# Patient Record
Sex: Female | Born: 1990 | Race: White | Hispanic: No | Marital: Single | Smoking: Never smoker
Health system: Southern US, Community
[De-identification: ages and names within clinical notes are randomized; demographics above are authoritative.]

## PROBLEM LIST (undated history)

## (undated) DIAGNOSIS — F32A Depression, unspecified: Secondary | ICD-10-CM

## (undated) DIAGNOSIS — F419 Anxiety disorder, unspecified: Secondary | ICD-10-CM

## (undated) DIAGNOSIS — F329 Major depressive disorder, single episode, unspecified: Secondary | ICD-10-CM

---

## 2013-05-31 ENCOUNTER — Emergency Department (HOSPITAL_COMMUNITY)
Admission: EM | Admit: 2013-05-31 | Discharge: 2013-05-31 | Disposition: A | Discharge status: Home / Self Care | Payer: Managed Care, Other (non HMO) | Source: Home / Self Care

## 2013-05-31 ENCOUNTER — Encounter (HOSPITAL_COMMUNITY): Payer: Self-pay | Admitting: Emergency Medicine

## 2013-05-31 ENCOUNTER — Emergency Department (INDEPENDENT_AMBULATORY_CARE_PROVIDER_SITE_OTHER): Payer: Managed Care, Other (non HMO)

## 2013-05-31 DIAGNOSIS — Y9302 Activity, running: Secondary | ICD-10-CM

## 2013-05-31 DIAGNOSIS — S93409A Sprain of unspecified ligament of unspecified ankle, initial encounter: Secondary | ICD-10-CM

## 2013-05-31 DIAGNOSIS — S93402A Sprain of unspecified ligament of left ankle, initial encounter: Secondary | ICD-10-CM

## 2013-05-31 NOTE — ED Provider Notes (Signed)
Medical screening examination/treatment/procedure(s) were performed by non-physician practitioner and as supervising physician I was immediately available for consultation/collaboration.  Leslee Home, M.D.  Reuben Likes, MD 05/31/13 2221

## 2013-05-31 NOTE — ED Provider Notes (Signed)
CSN: [id number]     Arrival date & time 05/31/13  1422 History   First MD Initiated Contact with Patient 05/31/13 1612     Chief Complaint  Patient presents with  . Ankle Injury   (Consider location/radiation/quality/duration/timing/severity/associated sxs/prior Treatment) HPI Comments: While running this afternoon at 1:30 the patient experienced an inversion injury to the right ankle. She is complaining of lateral ankle pain and tenderness. Denies other injury.   History reviewed. No pertinent past medical history. History reviewed. No pertinent past surgical history. History reviewed. No pertinent family history. History  Substance Use Topics  . Smoking status: Never Smoker   . Smokeless tobacco: Not on file  . Alcohol Use: Yes   OB History   Grav Para Term Preterm Abortions TAB SAB Ect Mult Living                 Review of Systems  Constitutional: Negative for fever, chills and activity change.  HENT: Negative.   Respiratory: Negative.   Cardiovascular: Negative.   Musculoskeletal:       As per HPI  Skin: Negative for color change, pallor and rash.  Neurological: Negative.     Allergies  Review of patient's allergies indicates no known allergies.  Home Medications   Current Outpatient Rx  Name  Route  Sig  Dispense  Refill  . FLUoxetine (PROZAC) 20 MG tablet   Oral   Take 20 mg by mouth daily.          BP 114/79  Pulse 90  Temp(Src) 97.7 F (36.5 C) (Oral)  Resp 16  SpO2 100%  LMP 05/01/2013 Physical Exam  Nursing note and vitals reviewed. Constitutional: She is oriented to person, place, and time. She appears well-developed and well-nourished. No distress.  HENT:  Head: Normocephalic and atraumatic.  Eyes: EOM are normal.  Neck: Normal range of motion. Neck supple.  Pulmonary/Chest: Effort normal. No respiratory distress.  Musculoskeletal:  Tenderness with mild edema over the lateral malleolus. Distal N/V and M/S intact. Pedal pulse 1+. Nl warmth  and color. No foot tenderness. No ankle deformity.   Neurological: She is alert and oriented to person, place, and time. No cranial nerve deficit.  Skin: Skin is warm and dry.    ED Course  Procedures (including critical care time) Labs Review Labs Reviewed - No data to display Imaging Review Dg Ankle Complete Left  05/31/2013   CLINICAL DATA:  Twisted left ankle, lateral pain  EXAM: LEFT ANKLE COMPLETE - 3+ VIEW  COMPARISON:  None.  FINDINGS: Three views of left ankle submitted. No acute fracture or subluxation. Ankle mortise is preserved.  IMPRESSION: Negative.   Electronically Signed   By: Natasha Mead M.D.   On: 05/31/2013 16:32      MDM   1. Ankle sprain, left, initial encounter    RICE ASO Limit wt bearing for a few days MOtrin for pain     Hayden Rasmussen, NP 05/31/13 1704

## 2013-05-31 NOTE — ED Notes (Signed)
Reports rolling left ankle today around 1:30.  States having a lot of pain with walking. Mild swelling.  No otc meds taking for pain.

## 2014-02-27 ENCOUNTER — Encounter (HOSPITAL_COMMUNITY): Payer: Self-pay | Admitting: Emergency Medicine

## 2014-02-27 ENCOUNTER — Emergency Department (HOSPITAL_COMMUNITY)
Admission: EM | Admit: 2014-02-27 | Discharge: 2014-02-27 | Disposition: A | Discharge status: Home / Self Care | Payer: Managed Care, Other (non HMO) | Source: Home / Self Care | Attending: Family Medicine | Admitting: Family Medicine

## 2014-02-27 DIAGNOSIS — S93609A Unspecified sprain of unspecified foot, initial encounter: Secondary | ICD-10-CM

## 2014-02-27 DIAGNOSIS — S91309A Unspecified open wound, unspecified foot, initial encounter: Secondary | ICD-10-CM

## 2014-02-27 DIAGNOSIS — S91312A Laceration without foreign body, left foot, initial encounter: Secondary | ICD-10-CM

## 2014-02-27 DIAGNOSIS — S96912A Strain of unspecified muscle and tendon at ankle and foot level, left foot, initial encounter: Secondary | ICD-10-CM

## 2014-02-27 DIAGNOSIS — S9032XA Contusion of left foot, initial encounter: Secondary | ICD-10-CM

## 2014-02-27 DIAGNOSIS — S9030XA Contusion of unspecified foot, initial encounter: Secondary | ICD-10-CM

## 2014-02-27 MED ORDER — SILVER SULFADIAZINE 1 % EX CREA
TOPICAL_CREAM | CUTANEOUS | Status: AC
  Filled 2014-02-27: qty 85

## 2014-02-27 MED ORDER — CEPHALEXIN 500 MG PO CAPS: 500.0000 mg | ORAL_CAPSULE | Freq: Three times a day (TID) | ORAL | Status: DC
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
## 2014-02-27 NOTE — ED Notes (Signed)
Applied silvadene and dressing applied

## 2014-02-27 NOTE — ED Notes (Signed)
Right foot pain.  Patient reports she was wearing flip-flops on uneven ground, bark, twigs, rolled foot.  Afterwards noticed something sticky, then noticed the blood.  This occurred last night.  Today right foot is hurting.  Reports todays pain similar to an instep injury she had during the summer.  Looks like a puncture injury.  Denies etoh use last night.

## 2014-02-27 NOTE — Discharge Instructions (Signed)
Fortunately you do not likely have a broken foot.  You likely are suffering from what is called a soft tissue contusion to the bottom of the foot.  This should improve with rest, Motrin 600-800mg  every 6-8 hours, ACE wrapping, and ice for the next 24-48 hours Please let pain be your guide with regards to returning to normal activity Please start the antibiotics is your cut becomes painful, red, or develops discharge Please apply antibiotic ointment daily to the cut until it closes over.

## 2014-07-27 ENCOUNTER — Emergency Department (HOSPITAL_COMMUNITY)
Admission: EM | Admit: 2014-07-27 | Discharge: 2014-07-27 | Disposition: A | Discharge status: Home / Self Care | Payer: Managed Care, Other (non HMO) | Attending: Emergency Medicine | Admitting: Emergency Medicine

## 2014-07-27 ENCOUNTER — Emergency Department (HOSPITAL_COMMUNITY): Payer: Managed Care, Other (non HMO)

## 2014-07-27 ENCOUNTER — Encounter (HOSPITAL_COMMUNITY): Payer: Self-pay | Admitting: Oncology

## 2014-07-27 DIAGNOSIS — Z3202 Encounter for pregnancy test, result negative: Secondary | ICD-10-CM | POA: Diagnosis not present

## 2014-07-27 DIAGNOSIS — F419 Anxiety disorder, unspecified: Secondary | ICD-10-CM | POA: Diagnosis not present

## 2014-07-27 DIAGNOSIS — Y9389 Activity, other specified: Secondary | ICD-10-CM | POA: Diagnosis not present

## 2014-07-27 DIAGNOSIS — S80219A Abrasion, unspecified knee, initial encounter: Secondary | ICD-10-CM

## 2014-07-27 DIAGNOSIS — F329 Major depressive disorder, single episode, unspecified: Secondary | ICD-10-CM | POA: Diagnosis not present

## 2014-07-27 DIAGNOSIS — Y998 Other external cause status: Secondary | ICD-10-CM | POA: Insufficient documentation

## 2014-07-27 DIAGNOSIS — S80211A Abrasion, right knee, initial encounter: Secondary | ICD-10-CM | POA: Diagnosis not present

## 2014-07-27 DIAGNOSIS — Y92410 Unspecified street and highway as the place of occurrence of the external cause: Secondary | ICD-10-CM | POA: Insufficient documentation

## 2014-07-27 DIAGNOSIS — Z79899 Other long term (current) drug therapy: Secondary | ICD-10-CM | POA: Diagnosis not present

## 2014-07-27 DIAGNOSIS — S80212A Abrasion, left knee, initial encounter: Secondary | ICD-10-CM | POA: Diagnosis not present

## 2014-07-27 DIAGNOSIS — S99912A Unspecified injury of left ankle, initial encounter: Secondary | ICD-10-CM | POA: Diagnosis present

## 2014-07-27 DIAGNOSIS — S93401A Sprain of unspecified ligament of right ankle, initial encounter: Secondary | ICD-10-CM | POA: Diagnosis not present

## 2014-07-27 DIAGNOSIS — S93402A Sprain of unspecified ligament of left ankle, initial encounter: Secondary | ICD-10-CM

## 2014-07-27 HISTORY — DX: Depression, unspecified: F32.A

## 2014-07-27 HISTORY — DX: Major depressive disorder, single episode, unspecified: F32.9

## 2014-07-27 HISTORY — DX: Anxiety disorder, unspecified: F41.9

## 2014-07-27 LAB — POC URINE PREG, ED: Preg Test, Ur: NEGATIVE

## 2014-07-27 MED ORDER — HYDROCODONE-ACETAMINOPHEN 5-325 MG PO TABS
1.0000 | ORAL_TABLET | Freq: Once | ORAL | Status: AC
  Administered 2014-07-27: 1 via ORAL
  Filled 2014-07-27: qty 1

## 2014-07-27 MED ORDER — IBUPROFEN 800 MG PO TABS
800.0000 mg | ORAL_TABLET | Freq: Once | ORAL | Status: AC
  Administered 2014-07-27: 800 mg via ORAL
  Filled 2014-07-27: qty 1

## 2014-07-27 MED ORDER — HYDROCODONE-ACETAMINOPHEN 5-325 MG PO TABS: 1.0000 | ORAL_TABLET | ORAL | Status: AC | PRN

## 2014-07-27 MED ORDER — IBUPROFEN 800 MG PO TABS: 800.0000 mg | ORAL_TABLET | Freq: Three times a day (TID) | ORAL | Status: AC
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
## 2014-07-27 NOTE — Discharge Instructions (Signed)
Ankle Sprain °An ankle sprain is an injury to the strong, fibrous tissues (ligaments) that hold the bones of your ankle joint together.  °CAUSES °An ankle sprain is usually caused by a fall or by twisting your ankle. Ankle sprains most commonly occur when you step on the outer edge of your foot, and your ankle turns inward. People who participate in sports are more prone to these types of injuries.  °SYMPTOMS  °· Pain in your ankle. The pain may be present at rest or only when you are trying to stand or walk. °· Swelling. °· Bruising. Bruising may develop immediately or within 1 to 2 days after your injury. °· Difficulty standing or walking, particularly when turning corners or changing directions. °DIAGNOSIS  °Your caregiver will ask you details about your injury and perform a physical exam of your ankle to determine if you have an ankle sprain. During the physical exam, your caregiver will press on and apply pressure to specific areas of your foot and ankle. Your caregiver will try to move your ankle in certain ways. An X-ray exam may be done to be sure a bone was not broken or a ligament did not separate from one of the bones in your ankle (avulsion fracture).  °TREATMENT  °Certain types of braces can help stabilize your ankle. Your caregiver can make a recommendation for this. Your caregiver may recommend the use of medicine for pain. If your sprain is severe, your caregiver may refer you to a surgeon who helps to restore function to parts of your skeletal system (orthopedist) or a physical therapist. °HOME CARE INSTRUCTIONS  °· Apply ice to your injury for 1-2 days or as directed by your caregiver. Applying ice helps to reduce inflammation and pain. °· Put ice in a plastic bag. °· Place a towel between your skin and the bag. °· Leave the ice on for 15-20 minutes at a time, every 2 hours while you are awake. °· Only take over-the-counter or prescription medicines for pain, discomfort, or fever as directed by  your caregiver. °· Elevate your injured ankle above the level of your heart as much as possible for 2-3 days. °· If your caregiver recommends crutches, use them as instructed. Gradually put weight on the affected ankle. Continue to use crutches or a cane until you can walk without feeling pain in your ankle. °· If you have a plaster splint, wear the splint as directed by your caregiver. Do not rest it on anything harder than a pillow for the first 24 hours. Do not put weight on it. Do not get it wet. You may take it off to take a shower or bath. °· You may have been given an elastic bandage to wear around your ankle to provide support. If the elastic bandage is too tight (you have numbness or tingling in your foot or your foot becomes cold and blue), adjust the bandage to make it comfortable. °· If you have an air splint, you may blow more air into it or let air out to make it more comfortable. You may take your splint off at night and before taking a shower or bath. Wiggle your toes in the splint several times per day to decrease swelling. °SEEK MEDICAL CARE IF:  °· You have rapidly increasing bruising or swelling. °· Your toes feel extremely cold or you lose feeling in your foot. °· Your pain is not relieved with medicine. °SEEK IMMEDIATE MEDICAL CARE IF: °· Your toes are numb or blue. °·   You have severe pain that is increasing. MAKE SURE YOU:   Understand these instructions.  Will watch your condition.  Will get help right away if you are not doing well or get worse. Document Released: 06/13/2005 Document Revised: 03/07/2012 Document Reviewed: 06/25/2011 [facility] Patient Information 2015 [place], [place]. This information is not intended to replace advice given to you by your health care provider. Make sure you discuss any questions you have with your health care provider.  Abrasion An abrasion is a cut or scrape of the skin. Abrasions do not extend through all layers of the skin and most heal within  10 days. It is important to care for your abrasion properly to prevent infection. CAUSES  Most abrasions are caused by falling on, or gliding across, the ground or other surface. When your skin rubs on something, the outer and inner layer of skin rubs off, causing an abrasion. DIAGNOSIS  Your caregiver will be able to diagnose an abrasion during a physical exam.  TREATMENT  Your treatment depends on how large and deep the abrasion is. Generally, your abrasion will be cleaned with water and a mild soap to remove any dirt or debris. An antibiotic ointment may be put over the abrasion to prevent an infection. A bandage (dressing) may be wrapped around the abrasion to keep it from getting dirty.  You may need a tetanus shot if:  You cannot remember when you had your last tetanus shot.  You have never had a tetanus shot.  The injury broke your skin. If you get a tetanus shot, your arm may swell, get red, and feel warm to the touch. This is common and not a problem. If you need a tetanus shot and you choose not to have one, there is a rare chance of getting tetanus. Sickness from tetanus can be serious.  HOME CARE INSTRUCTIONS   If a dressing was applied, change it at least once a day or as directed by your caregiver. If the bandage sticks, soak it off with warm water.   Wash the area with water and a mild soap to remove all the ointment 2 times a day. Rinse off the soap and pat the area dry with a clean towel.   Reapply any ointment as directed by your caregiver. This will help prevent infection and keep the bandage from sticking. Use gauze over the wound and under the dressing to help keep the bandage from sticking.   Change your dressing right away if it becomes wet or dirty.   Only take over-the-counter or prescription medicines for pain, discomfort, or fever as directed by your caregiver.   Follow up with your caregiver within 24-48 hours for a wound check, or as directed. If you were  not given a wound-check appointment, look closely at your abrasion for redness, swelling, or pus. These are signs of infection. SEEK IMMEDIATE MEDICAL CARE IF:   You have increasing pain in the wound.   You have redness, swelling, or tenderness around the wound.   You have pus coming from the wound.   You have a fever or persistent symptoms for more than 2-3 days.  You have a fever and your symptoms suddenly get worse.  You have a bad smell coming from the wound or dressing.  MAKE SURE YOU:   Understand these instructions.  Will watch your condition.  Will get help right away if you are not doing well or get worse. Document Released: 03/23/2005 Document Revised: 05/30/2012 Document Reviewed: 05/17/2011  [employer] Patient Information 2015 [place], [place]. This information is not intended to replace advice given to you by your health care provider. Make sure you discuss any questions you have with your health care provider. Cryotherapy Cryotherapy means treatment with cold. Ice or gel packs can be used to reduce both pain and swelling. Ice is the most helpful within the first 24 to 48 hours after an injury or flare-up from overusing a muscle or joint. Sprains, strains, spasms, burning pain, shooting pain, and aches can all be eased with ice. Ice can also be used when recovering from surgery. Ice is effective, has very few side effects, and is safe for most people to use. PRECAUTIONS  Ice is not a safe treatment option for people with:  Raynaud phenomenon. This is a condition affecting small blood vessels in the extremities. Exposure to cold may cause your problems to return.  Cold hypersensitivity. There are many forms of cold hypersensitivity, including:  Cold urticaria. Red, itchy hives appear on the skin when the tissues begin to warm after being iced.  Cold erythema. This is a red, itchy rash caused by exposure to cold.  Cold hemoglobinuria. Red blood cells break down when the  tissues begin to warm after being iced. The hemoglobin that carry oxygen are passed into the urine because they cannot combine with blood proteins fast enough.  Numbness or altered sensitivity in the area being iced. If you have any of the following conditions, do not use ice until you have discussed cryotherapy with your caregiver:  Heart conditions, such as arrhythmia, angina, or chronic heart disease.  High blood pressure.  Healing wounds or open skin in the area being iced.  Current infections.  Rheumatoid arthritis.  Poor circulation.  Diabetes. Ice slows the blood flow in the region it is applied. This is beneficial when trying to stop inflamed tissues from spreading irritating chemicals to surrounding tissues. However, if you expose your skin to cold temperatures for too long or without the proper protection, you can damage your skin or nerves. Watch for signs of skin damage due to cold. HOME CARE INSTRUCTIONS Follow these tips to use ice and cold packs safely.  Place a dry or damp towel between the ice and skin. A damp towel will cool the skin more quickly, so you may need to shorten the time that the ice is used.  For a more rapid response, add gentle compression to the ice.  Ice for no more than 10 to 20 minutes at a time. The bonier the area you are icing, the less time it will take to get the benefits of ice.  Check your skin after 5 minutes to make sure there are no signs of a poor response to cold or skin damage.  Rest 20 minutes or more between uses.  Once your skin is numb, you can end your treatment. You can test numbness by very lightly touching your skin. The touch should be so light that you do not see the skin dimple from the pressure of your fingertip. When using ice, most people will feel these normal sensations in this order: cold, burning, aching, and numbness.  Do not use ice on someone who cannot communicate their responses to pain, such as small children  or people with dementia. HOW TO MAKE AN ICE PACK Ice packs are the most common way to use ice therapy. Other methods include ice massage, ice baths, and cryosprays. Muscle creams that cause a cold, tingly feeling do not  offer the same benefits that ice offers and should not be used as a substitute unless recommended by your caregiver. To make an ice pack, do one of the following:  Place crushed ice or a bag of frozen vegetables in a sealable plastic bag. Squeeze out the excess air. Place this bag inside another plastic bag. Slide the bag into a pillowcase or place a damp towel between your skin and the bag.  Mix 3 parts water with 1 part rubbing alcohol. Freeze the mixture in a sealable plastic bag. When you remove the mixture from the freezer, it will be slushy. Squeeze out the excess air. Place this bag inside another plastic bag. Slide the bag into a pillowcase or place a damp towel between your skin and the bag. SEEK MEDICAL CARE IF:  You develop white spots on your skin. This may give the skin a blotchy (mottled) appearance.  Your skin turns blue or pale.  Your skin becomes waxy or hard.  Your swelling gets worse. MAKE SURE YOU:   Understand these instructions.  Will watch your condition.  Will get help right away if you are not doing well or get worse. Document Released: 02/07/2011 Document Revised: 10/28/2013 Document Reviewed: 02/07/2011 [facility] Patient Information 2015 [place], [place]. This information is not intended to replace advice given to you by your health care provider. Make sure you discuss any questions you have with your health care provider.

## 2014-07-27 NOTE — ED Notes (Signed)
Pt back from X-ray.  
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
## 2014-07-27 NOTE — ED Notes (Signed)
Pt transported to Xray. 

## 2016-09-25 ENCOUNTER — Encounter (HOSPITAL_COMMUNITY): Payer: Self-pay

## 2016-09-25 ENCOUNTER — Emergency Department (HOSPITAL_COMMUNITY)
Admission: EM | Admit: 2016-09-25 | Discharge: 2016-09-25 | Disposition: A | Discharge status: Home / Self Care | Payer: BLUE CROSS/BLUE SHIELD | Attending: Emergency Medicine | Admitting: Emergency Medicine

## 2016-09-25 ENCOUNTER — Other Ambulatory Visit: Payer: Self-pay

## 2016-09-25 ENCOUNTER — Emergency Department (HOSPITAL_COMMUNITY): Payer: BLUE CROSS/BLUE SHIELD

## 2016-09-25 DIAGNOSIS — R079 Chest pain, unspecified: Secondary | ICD-10-CM | POA: Diagnosis present

## 2016-09-25 DIAGNOSIS — R0789 Other chest pain: Secondary | ICD-10-CM | POA: Diagnosis not present

## 2016-09-25 LAB — CBC
HCT: 37.8 % (ref 36.0–46.0)
HEMOGLOBIN: 12.9 g/dL (ref 12.0–15.0)
MCH: 29.1 pg (ref 26.0–34.0)
MCHC: 34.1 g/dL (ref 30.0–36.0)
MCV: 85.1 fL (ref 78.0–100.0)
Platelets: 339 10*3/uL (ref 150–400)
RBC: 4.44 MIL/uL (ref 3.87–5.11)
RDW: 13.4 % (ref 11.5–15.5)
WBC: 9.2 10*3/uL (ref 4.0–10.5)

## 2016-09-25 LAB — BASIC METABOLIC PANEL
ANION GAP: 8 (ref 5–15)
BUN: 15 mg/dL (ref 6–20)
CHLORIDE: 109 mmol/L (ref 101–111)
CO2: 21 mmol/L — AB (ref 22–32)
CREATININE: 0.82 mg/dL (ref 0.44–1.00)
Calcium: 9.4 mg/dL (ref 8.9–10.3)
GFR calc Af Amer: >60 mL/min (ref >60)
GFR calc non Af Amer: >60 mL/min (ref >60)
GLUCOSE: 109 mg/dL — AB (ref 65–99)
Potassium: 3.9 mmol/L (ref 3.5–5.1)
Sodium: 138 mmol/L (ref 135–145)

## 2016-09-25 LAB — I-STAT TROPONIN, ED: TROPONIN I, POC: 0 ng/mL (ref 0.00–0.08)

## 2016-09-25 LAB — D-DIMER, QUANTITATIVE: D-Dimer, Quant: <0.27 ug/mL-FEU (ref 0.00–0.50)

## 2016-09-25 MED ORDER — KETOROLAC TROMETHAMINE 30 MG/ML IJ SOLN
30.0000 mg | Freq: Once | INTRAMUSCULAR | Status: AC
  Administered 2016-09-25: 30 mg via INTRAVENOUS
  Filled 2016-09-25: qty 1

## 2016-09-25 MED ORDER — METHOCARBAMOL 500 MG PO TABS: ORAL_TABLET | ORAL | 0 refills | Status: AC

## 2016-09-25 MED ORDER — METHOCARBAMOL 500 MG PO TABS
1000.0000 mg | ORAL_TABLET | Freq: Once | ORAL | Status: AC
Start: 2016-09-25 — End: 2016-09-25
  Administered 2016-09-25: 1000 mg via ORAL
  Filled 2016-09-25: qty 2

## 2016-09-25 MED ORDER — NAPROXEN 500 MG PO TABS: ORAL_TABLET | ORAL | 0 refills | Status: AC

## 2016-09-25 NOTE — ED Notes (Signed)
Pt understood dc material. NAD noted. Scripts given at dc 

## 2016-09-25 NOTE — ED Notes (Signed)
Nurse drawing addl labs 

## 2016-09-25 NOTE — ED Triage Notes (Signed)
Pt complaining of R sided chest pain that radiates to R shoulder. Pt denies any injury/trauma to chest. Pt denies any cough. Pt states hx of bronchitis.

## 2016-09-25 NOTE — ED Provider Notes (Signed)
[facility]-EMERGENCY DEPT Provider Note   CSN: [id number] & time: 09/25/16  0118   By signing my name below, I, Soijett Blue, attest that this documentation has been prepared under the direction and in the presence of Devoria Albe, MD. Electronically Signed: Soijett Blue, ED Scribe. 09/25/16. 4:57 AM.  Time seen 03:11 AM  History   Chief Complaint Chief Complaint  Patient presents with  . Chest Pain    HPI Marilyn Wheeler is a 26 y.o. female who presents to the Emergency Department complaining of waxing and waning, sharp, right upper CP onset 9:30 AM yesterday morning, March 31. She notes that she was sleeping when the CP began and woke her from her sleep. She states that her right upper chest pain is worsened with right arm movement, talking, any body movement. Staying still makes it better. Pt notes that she doesn't have a hx of similar symptoms. Pt reports associated SOB. Pt has tried 2 Advil with no relief of her symptoms. 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 Pt reports that she is right hand dominant. Pt takes oral contraceptives and Prozac. She denies nausea, vomiting, diaphoresis, leg pain, leg swelling, calf pain, and any other symptoms. Denies having a PCP in the area, smoking cigarettes, but endorses social ETOH use. Pt notes that her grandmother had a hx of DVT's and denies family cardiac issues.    The history is provided by the patient. No language interpreter was used.    PCP none  Past Medical History:  Diagnosis Date  . Anxiety   . Depression     There are no active problems to display for this patient.   History reviewed. No pertinent surgical history.  OB History    No data available       Home Medications    Prior to Admission medications   Medication Sig Start Date End Date Taking? Authorizing Provider  cephALEXin (KEFLEX) 500 MG capsule Take 1 capsule (500 mg total) by mouth 3  (three) times daily. Patient not taking: Reported on 07/27/2014 02/27/14   Ozella Rocks, MD  FLUoxetine (PROZAC) 20 MG tablet Take 40 mg by mouth daily.     Historical Provider, MD  HYDROcodone-acetaminophen (NORCO/VICODIN) 5-325 MG per tablet Take 1-2 tablets by mouth every 4 (four) hours as needed. 07/27/14   Elpidio Anis, PA-C  ibuprofen (ADVIL,MOTRIN) 800 MG tablet Take 1 tablet (800 mg total) by mouth 3 (three) times daily. 07/27/14   Elpidio Anis, PA-C  methocarbamol (ROBAXIN) 500 MG tablet Take 1 or 2 po Q 6hrs for muscle pain 09/25/16   Devoria Albe, MD  naproxen (NAPROSYN) 500 MG tablet Take 1 po BID with food prn pain 09/25/16   Devoria Albe, MD  Norethindrone Acet-Ethinyl Est (GILDESS 1/20 PO) Take 1 tablet by mouth every morning.     Historical Provider, MD    Family History Family History  Problem Relation Age of Onset  . Hypertension Father     Social History Social History  Substance Use Topics  . Smoking status: Never Smoker  . Smokeless tobacco: Never Used  . Alcohol use Yes  employed   Allergies   Patient has no known allergies.   Review of Systems Review of Systems  Constitutional: Negative for diaphoresis.  Respiratory: Positive for shortness of breath.   Cardiovascular: Positive for chest pain (right upper). Negative for leg swelling.  Gastrointestinal: Negative for nausea and vomiting.  Musculoskeletal: Negative  for arthralgias and myalgias.  All other systems reviewed and are negative.   Physical Exam Updated Vital Signs BP (!) 155/108 (BP Location: Left Arm)   Pulse 98   Temp 97.6 F (36.4 C) (Oral)   Resp 16   Ht  (1.727 m)   Wt 190 lb (86.2 kg)   LMP 09/22/2016 (Exact Date)   SpO2 99%   BMI 28.89 kg/m   Vital signs normal except for hypertension   Physical Exam  Constitutional: She is oriented to person, place, and time. She appears well-developed and well-nourished.  Non-toxic appearance. She does not appear ill. No distress.  HENT:    Head: Normocephalic and atraumatic.  Right Ear: External ear normal.  Left Ear: External ear normal.  Nose: Nose normal. No mucosal edema or rhinorrhea.  Mouth/Throat: Oropharynx is clear and moist and mucous membranes are normal. No dental abscesses or uvula swelling.  Eyes: Conjunctivae and EOM are normal. Pupils are equal, round, and reactive to light.  Neck: Normal range of motion and full passive range of motion without pain. Neck supple.  Cardiovascular: Normal rate, regular rhythm and normal heart sounds.  Exam reveals no gallop and no friction rub.   No murmur heard. Pulmonary/Chest: Effort normal and breath sounds normal. No respiratory distress. She has no wheezes. She has no rhonchi. She has no rales. She exhibits tenderness. She exhibits no crepitus.    Mild tenderness to right upper chest with palpation and ROM of right arm.   Abdominal: Soft. Normal appearance and bowel sounds are normal. She exhibits no distension. There is no tenderness. There is no rebound and no guarding.  Musculoskeletal: Normal range of motion. She exhibits no edema or tenderness.  Moves all extremities well.   Neurological: She is alert and oriented to person, place, and time. She has normal strength. No cranial nerve deficit.  Skin: Skin is warm, dry and intact. No rash noted. No erythema. No pallor.  Psychiatric: She has a normal mood and affect. Her speech is normal and behavior is normal. Her mood appears not anxious.  Nursing note and vitals reviewed.    ED Treatments / Results  Labs (all labs ordered are listed, but only abnormal results are displayed) Results for orders placed or performed during the hospital encounter of 09/25/16  Basic metabolic panel  Result Value Ref Range   Sodium 138 135 - 145 mmol/L   Potassium 3.9 3.5 - 5.1 mmol/L   Chloride 109 101 - 111 mmol/L   CO2 21 (L) 22 - 32 mmol/L   Glucose, Bld 109 (H) 65 - 99 mg/dL   BUN 15 6 - 20 mg/dL   Creatinine, Ser 1.61 0.44 -  1.00 mg/dL   Calcium 9.4 8.9 - 09.6 mg/dL   GFR calc non Af Amer >60 >60 mL/min   GFR calc Af Amer >60 >60 mL/min   Anion gap 8 5 - 15  CBC  Result Value Ref Range   WBC 9.2 4.0 - 10.5 K/uL   RBC 4.44 3.87 - 5.11 MIL/uL   Hemoglobin 12.9 12.0 - 15.0 g/dL   HCT 04.5 40.9 - 81.1 %   MCV 85.1 78.0 - 100.0 fL   MCH 29.1 26.0 - 34.0 pg   MCHC 34.1 30.0 - 36.0 g/dL   RDW 91.4 78.2 - 95.6 %   Platelets 339 150 - 400 K/uL  D-dimer, quantitative  Result Value Ref Range   D-Dimer, Quant <0.27 0.00 - 0.50 ug/mL-FEU  I-stat troponin, ED  Result Value Ref Range   Troponin i, poc 0.00 0.00 - 0.08 ng/mL   Comment 3           Laboratory interpretation all normal     EKG  EKG Interpretation  Date/Time:  Sunday September 25 2016 01:23:31 EDT Ventricular Rate:  78 PR Interval:  162 QRS Duration: 82 QT Interval:  344 QTC Calculation: 392 R Axis:   80 Text Interpretation:  Unusual P axis, possible ectopic atrial rhythm Normal ECG No old tracing to compare Confirmed by Buffi Ewton  MD-I, Roy Snuffer ([id number]) on 09/25/2016 2:21:17 AM       Radiology Dg Chest 2 View  Result Date: 09/25/2016 CLINICAL DATA:  Mid chest pain for 1 day, shortness of breath. Mild bronchitic changes. EXAM: CHEST  2 VIEW COMPARISON:  None. FINDINGS: Cardiomediastinal silhouette is normal. No pleural effusions or focal consolidations. Trachea projects midline and there is no pneumothorax. Soft tissue planes and included osseous structures are non-suspicious. IMPRESSION: Mild bronchitic changes without focal consolidation. Electronically Signed   By: Awilda Metro M.D.   On: 09/25/2016 02:02    Procedures Procedures (including critical care time)  Medications Ordered in ED Medications  ketorolac (TORADOL) 30 MG/ML injection 30 mg (30 mg Intravenous Given 09/25/16 0340)  methocarbamol (ROBAXIN) tablet 1,000 mg (1,000 mg Oral Given 09/25/16 0340)     Initial Impression / Assessment and Plan / ED Course  I have reviewed the triage  vital signs and the nursing notes.  Pertinent labs & imaging results that were available during my care of the patient were reviewed by me and considered in my medical decision making (see chart for details).     DIAGNOSTIC STUDIES: Oxygen Saturation is 99% on RA, nl by my interpretation.    COORDINATION OF CARE:  Discussed her laboratory, EKG and CXR results.  Treatment plan includes adding a ddimer and IV toradol and oral muscle relaxer.  Since her pain has been there constantly all day a seconds or delta troponin was not done. Her pain appears to be musculoskeletal in origin. However d-dimer was done due to her being on birth control pills and having some shortness of breath.  At time of discharge her pain is better not gone.    Final Clinical Impressions(s) / ED Diagnoses   Final diagnoses:  Chest wall pain    New Prescriptions New Prescriptions   METHOCARBAMOL (ROBAXIN) 500 MG TABLET    Take 1 or 2 po Q 6hrs for muscle pain   NAPROXEN (NAPROSYN) 500 MG TABLET    Take 1 po BID with food prn pain   Plan discharge  Devoria Albe, MD, FACEP  I personally performed the services described in this documentation, which was scribed in my presence. The recorded information has been reviewed and considered.  Devoria Albe, MD, Concha Pyo, MD 09/25/16 [phone]

## 2016-09-25 NOTE — Discharge Instructions (Signed)
Use ice and heat for comfort. Take the medications as prescribed. Recheck if you get worse, such as fever, struggling to breathe, worsening pain.

## 2018-03-26 ENCOUNTER — Other Ambulatory Visit: Payer: Self-pay

## 2018-03-26 ENCOUNTER — Encounter (HOSPITAL_COMMUNITY): Payer: Self-pay | Admitting: *Deleted

## 2018-03-26 ENCOUNTER — Emergency Department (HOSPITAL_COMMUNITY)
Admission: EM | Admit: 2018-03-26 | Discharge: 2018-03-26 | Disposition: A | Discharge status: Home / Self Care | Payer: BLUE CROSS/BLUE SHIELD | Attending: Emergency Medicine | Admitting: Emergency Medicine

## 2018-03-26 ENCOUNTER — Emergency Department (HOSPITAL_COMMUNITY): Payer: BLUE CROSS/BLUE SHIELD

## 2018-03-26 DIAGNOSIS — M25512 Pain in left shoulder: Secondary | ICD-10-CM | POA: Diagnosis present

## 2018-03-26 DIAGNOSIS — R0789 Other chest pain: Secondary | ICD-10-CM | POA: Diagnosis not present

## 2018-03-26 DIAGNOSIS — L0291 Cutaneous abscess, unspecified: Secondary | ICD-10-CM

## 2018-03-26 DIAGNOSIS — L02414 Cutaneous abscess of left upper limb: Secondary | ICD-10-CM | POA: Diagnosis not present

## 2018-03-26 LAB — CBC
HEMATOCRIT: 37.4 % (ref 36.0–46.0)
HEMOGLOBIN: 12.5 g/dL (ref 12.0–15.0)
MCH: 29.1 pg (ref 26.0–34.0)
MCHC: 33.4 g/dL (ref 30.0–36.0)
MCV: 87 fL (ref 78.0–100.0)
Platelets: 407 10*3/uL — ABNORMAL HIGH (ref 150–400)
RBC: 4.3 MIL/uL (ref 3.87–5.11)
RDW: 13.1 % (ref 11.5–15.5)
WBC: 8.7 10*3/uL (ref 4.0–10.5)

## 2018-03-26 LAB — BASIC METABOLIC PANEL
ANION GAP: 9 (ref 5–15)
BUN: 15 mg/dL (ref 6–20)
CO2: 23 mmol/L (ref 22–32)
Calcium: 9.3 mg/dL (ref 8.9–10.3)
Chloride: 108 mmol/L (ref 98–111)
Creatinine, Ser: 0.61 mg/dL (ref 0.44–1.00)
GFR calc Af Amer: >60 mL/min (ref >60)
GLUCOSE: 84 mg/dL (ref 70–99)
POTASSIUM: 4.1 mmol/L (ref 3.5–5.1)
Sodium: 140 mmol/L (ref 135–145)

## 2018-03-26 LAB — I-STAT BETA HCG BLOOD, ED (NOT ORDERABLE): I-stat hCG, quantitative: <5 m[IU]/mL (ref <5)

## 2018-03-26 MED ORDER — CEPHALEXIN 500 MG PO CAPS: 500.0000 mg | ORAL_CAPSULE | Freq: Four times a day (QID) | ORAL | 0 refills | Status: AC

## 2018-03-26 MED ORDER — IBUPROFEN 200 MG PO TABS
600.0000 mg | ORAL_TABLET | Freq: Once | ORAL | Status: AC
  Administered 2018-03-26: 600 mg via ORAL
  Filled 2018-03-26: qty 3

## 2018-03-26 MED ORDER — LIDOCAINE HCL (PF) 1 % IJ SOLN
5.0000 mL | Freq: Once | INTRAMUSCULAR | Status: AC
  Administered 2018-03-26: 5 mL
  Filled 2018-03-26: qty 30

## 2018-03-26 NOTE — Discharge Instructions (Addendum)
I have prescribed antibiotics please take as directed.If you experience any fever, worsening symptoms please return to the ED for reevaluation.

## 2018-03-26 NOTE — ED Triage Notes (Signed)
Pt reports noticing an abscess in her R upper medial arm last night and is worsening.  She also endorses waking up with a cp this am.  She denies any SOB, dizziness, or n/v.  She reports being at a music festival last week and was outside.  Does not recall having any insect bite to her R upper arm.  Edema and erythema noted and is tender.

## 2018-03-26 NOTE — ED Provider Notes (Signed)
[facility]-EMERGENCY DEPT Provider Note   CSN: [id number] Arrival date & time: 03/26/18  1551     History   Chief Complaint Chief Complaint  Patient presents with  . Chest Pain  . Abscess    HPI Marilyn Wheeler is a 27 y.o. female.  27 y/o female with a PMH of anxiety presents to the ED with a chief complaint of left arm abscess which began 2 days ago.Patient reports she's had multiple abscesses previously but this one has gotten bigger. She reports the pain as pressure around her left shoulder region with no radiation. She has tried warm compresses and bacitracin and a Band-Aid but this has not help with the healing process. Patient was at work today when she was told by her manager that she might have a staph infection, after this she began to experience tight chest pressure along the left side of her chest along with difficulty breathing.She describes it as an 8/10. She reports now that the chest pain has subsided and states she thinks this was her anxiety due to fear of having a staph infection.She denies any alleviating symptoms, diaphoresis, fever, previous history of MRSA.      Past Medical History:  Diagnosis Date  . Anxiety   . Depression     There are no active problems to display for this patient.   History reviewed. No pertinent surgical history.   OB History   None      Home Medications    Prior to Admission medications   Medication Sig Start Date End Date Taking? Authorizing Provider  aspirin EC 325 MG tablet Take 325 mg by mouth every 6 (six) hours as needed for mild pain.   Yes [provider]  cephALEXin (KEFLEX) 500 MG capsule Take 1 capsule (500 mg total) by mouth 4 (four) times daily for 7 days. 03/26/18 04/02/18  Claude Manges, PA-C  HYDROcodone-acetaminophen (NORCO/VICODIN) 5-325 MG per tablet Take 1-2 tablets by mouth every 4 (four) hours as needed. Patient not taking: Reported on 03/26/2018 07/27/14   Elpidio Anis,  PA-C  ibuprofen (ADVIL,MOTRIN) 800 MG tablet Take 1 tablet (800 mg total) by mouth 3 (three) times daily. Patient not taking: Reported on 03/26/2018 07/27/14   Elpidio Anis, PA-C  methocarbamol (ROBAXIN) 500 MG tablet Take 1 or 2 po Q 6hrs for muscle pain Patient not taking: Reported on 03/26/2018 09/25/16   Devoria Albe, MD  naproxen (NAPROSYN) 500 MG tablet Take 1 po BID with food prn pain Patient not taking: Reported on 03/26/2018 09/25/16   Devoria Albe, MD    Family History Family History  Problem Relation Age of Onset  . Hypertension Father     Social History Social History   Tobacco Use  . Smoking status: Never Smoker  . Smokeless tobacco: Never Used  Substance Use Topics  . Alcohol use: Yes  . Drug use: No     Allergies   Patient has no known allergies.   Review of Systems Review of Systems  Constitutional: Negative for chills and fever.  HENT: Negative for sore throat.   Respiratory: Positive for chest tightness and shortness of breath. Negative for wheezing.   Cardiovascular: Negative for chest pain.  Gastrointestinal: Positive for nausea. Negative for abdominal pain, diarrhea and vomiting.  Genitourinary: Negative for dysuria and flank pain.  Musculoskeletal: Negative for arthralgias and back pain.  Skin: Positive for color change and wound.  Neurological: Negative for headaches.  All other systems reviewed and are negative.  Physical Exam Updated Vital Signs BP 129/88   Pulse 83   Temp 98.8 F (37.1 C) (Oral)   Resp 13   Ht 5\' 8"  (1.727 m)   Wt 81.6 kg   LMP 02/26/2018   SpO2 100%   BMI 27.37 kg/m   Physical Exam  Constitutional: She is oriented to person, place, and time. She appears well-developed.  HENT:  Head: Normocephalic and atraumatic.  Neck: Normal range of motion. Neck supple.  Cardiovascular: Normal rate.  Pulmonary/Chest: Effort normal. She has no decreased breath sounds.  Abdominal: Soft. Bowel sounds are normal.  Musculoskeletal:         Right lower leg: She exhibits no edema.       Left lower leg: She exhibits no edema.  Neurological: She is alert and oriented to person, place, and time.  Skin: Skin is warm and dry. There is erythema.     erythematous, indurated, fluctuant abscess to left shoulder region.   Nursing note and vitals reviewed.    ED Treatments / Results  Labs (all labs ordered are listed, but only abnormal results are displayed) Labs Reviewed  CBC - Abnormal; Notable for the following components:      Result Value   Platelets 407 (*)    All other components within normal limits  BASIC METABOLIC PANEL  I-STAT TROPONIN, ED  I-STAT BETA HCG BLOOD, ED ([facility], [facility], [facility] ONLY)  I-STAT BETA HCG BLOOD, ED (NOT ORDERABLE)    EKG None  Radiology Dg Chest 2 View  Result Date: 03/26/2018 CLINICAL DATA:  Soft tissue infection of the right arm. EXAM: CHEST - 2 VIEW COMPARISON:  09/25/2016 FINDINGS: Heart size is normal. Mediastinal shadows are normal. The lungs are clear. No bronchial thickening. No infiltrate, mass, effusion or collapse. Pulmonary vascularity is normal. No bony abnormality. IMPRESSION: Normal chest Electronically Signed   By: Paulina Fusi M.D.   On: 03/26/2018 16:23    Procedures .Marland KitchenIncision and Drainage Date/Time: 03/26/2018 9:47 PM Performed by: Claude Manges, PA-C Authorized by: Claude Manges, PA-C   Consent:    Consent obtained:  Verbal   Consent given by:  Patient   Risks discussed:  Bleeding, incomplete drainage, infection and pain   Alternatives discussed:  Delayed treatment and alternative treatment Location:    Type:  Abscess   Location:  Upper extremity   Upper extremity location:  Shoulder   Shoulder location:  L shoulder Pre-procedure details:    Skin preparation:  Betadine Anesthesia (see MAR for exact dosages):    Anesthesia method:  Local infiltration   Local anesthetic:  Lidocaine 1% w/o epi Procedure type:    Complexity:  Simple Procedure details:    Needle  aspiration: no     Incision types:  Stab incision   Incision depth:  Dermal   Scalpel blade:  11   Wound management:  Irrigated with saline and probed and deloculated   Drainage:  Serosanguinous   Drainage amount:  Scant   Wound treatment:  Drain placed   Packing materials:  1/2 in iodoform gauze   Amount 1/2" iodoform:  4 cm Post-procedure details:    Patient tolerance of procedure:  Tolerated well, no immediate complications   (including critical care time)  Medications Ordered in ED Medications  ibuprofen (ADVIL,MOTRIN) tablet 600 mg (600 mg Oral Given 03/26/18 2056)  lidocaine (PF) (XYLOCAINE) 1 % injection 5 mL (5 mLs Infiltration Given 03/26/18 2056)     Initial Impression / Assessment and Plan / ED Course  I have reviewed the triage vital signs and the nursing notes.  Pertinent labs & imaging results that were available during my care of the patient were reviewed by me and considered in my medical decision making (see chart for details).  Clinical Course as of Mar 26 2154  [facility] Mar 26, 2018  2043 Platelets(!): 407 [JS]    Clinical Course User Index [JS] Claude Manges, [place]    She is here for a left arm abscess which began 2 days ago.  She has tried warm compresses, bacitracin bandage and states no relieving symptoms.  Patient reports also an episode of chest pain this afternoon with her boss told her that she might have a staph infection.  Patient began to feel a left-sided chest tightness which was worse with breathing.  She now reports her chest pain has subsided and believe this was just an anxiety attack due to her boss telling her that she might have staff.  At this time there is a left indurated erythematous abscess to her left shoulder had a shared decision-making conversation with patient in which she would like for abscess to drain.  I will drain this abscess for patient and place patient on antibiotics.  We will give her Tylenol for her pain prior to abscess  drainage.  CBC showed slight elevation in platelets. BMP showed no electrolyte abnormality. DG chest showed no infiltrate, pneumothorax, pleural effusion. I stat troponin was collected.However, patient denies any chest pain at the time and states this was due to the fact that her boss told her she might have a staph infection. I performed an I&D on patient, she tolerated the procedure well. I will send patient with PO antimitotics.She is afebrile at this time, vitals stable during ED course, patients stable for discharge.     Final Clinical Impressions(s) / ED Diagnoses   Final diagnoses:  Abscess  Feeling of chest tightness    ED Discharge Orders         Ordered    cephALEXin (KEFLEX) 500 MG capsule  4 times daily     03/26/18 2155           Claude Manges, PA-C 03/26/18 2156    Tegeler, Canary Brim, MD 03/26/18 2330

## 2018-04-12 ENCOUNTER — Emergency Department (HOSPITAL_COMMUNITY)
Admission: EM | Admit: 2018-04-12 | Discharge: 2018-04-12 | Disposition: A | Discharge status: Home / Self Care | Payer: BLUE CROSS/BLUE SHIELD | Attending: Emergency Medicine | Admitting: Emergency Medicine

## 2018-04-12 ENCOUNTER — Other Ambulatory Visit: Payer: Self-pay

## 2018-04-12 ENCOUNTER — Encounter (HOSPITAL_COMMUNITY): Payer: Self-pay | Admitting: Emergency Medicine

## 2018-04-12 DIAGNOSIS — L03114 Cellulitis of left upper limb: Secondary | ICD-10-CM | POA: Insufficient documentation

## 2018-04-12 DIAGNOSIS — Z7982 Long term (current) use of aspirin: Secondary | ICD-10-CM | POA: Diagnosis not present

## 2018-04-12 DIAGNOSIS — Z79899 Other long term (current) drug therapy: Secondary | ICD-10-CM | POA: Diagnosis not present

## 2018-04-12 DIAGNOSIS — L02414 Cutaneous abscess of left upper limb: Secondary | ICD-10-CM | POA: Diagnosis present

## 2018-04-12 MED ORDER — CEPHALEXIN 500 MG PO CAPS: 500.0000 mg | ORAL_CAPSULE | Freq: Four times a day (QID) | ORAL | 0 refills | Status: AC

## 2018-04-12 NOTE — ED Triage Notes (Signed)
Pt reports having an abscess drained two weeks ago but reports another abscess on the same left arm but in a different spot that started this morning. About 3 inches in diameter, redness noted to skin, reports no drainage. Pain 10/10 upon palpation.

## 2018-04-12 NOTE — Discharge Instructions (Signed)
Please read instructions below.  Soak/flush your arm with warm water, multiple times per day. You can take Advil/ibuprofen every 6 hours as needed for pain. Take the antibiotic as prescribed until gone. Follow up with your primary care or urgent care for wound recheck in 2 days.  Return to the ER for fever, worsening redness, or new or worsening symptoms.

## 2018-04-12 NOTE — ED Provider Notes (Signed)
[facility] EMERGENCY DEPARTMENT Provider Note   CSN: [id number] Arrival date & time: 04/12/18  2154     History   Chief Complaint Chief Complaint  Patient presents with  . Abscess    HPI Marilyn Wheeler is a 27 y.o. female presenting to the emergency department with recurrent arm infection.  She states she woke up this morning and noticed her redness to her left forearm that is tender to the touch.  She states she was seen in the ED about 2 weeks ago and had abscess drained higher up on her arm.  She states that has resolved though this new redness has developed.  She also reports she has similar infection on her legs which resolved on their own.  She denies fevers or drainage, denies IV drug use.  She does report that she waxes her arms and legs at home and has been using the same wax pot for some time now.  Recently waxed her arms.  No new medications.  The history is provided by the patient.    Past Medical History:  Diagnosis Date  . Anxiety   . Depression     There are no active problems to display for this patient.   History reviewed. No pertinent surgical history.   OB History   None      Home Medications    Prior to Admission medications   Medication Sig Start Date End Date Taking? Authorizing Provider  aspirin EC 325 MG tablet Take 325 mg by mouth every 6 (six) hours as needed for mild pain.    [provider]  cephALEXin (KEFLEX) 500 MG capsule Take 1 capsule (500 mg total) by mouth 4 (four) times daily for 5 days. 04/12/18 04/17/18  Robinson, [place] N, PA-C  HYDROcodone-acetaminophen (NORCO/VICODIN) 5-325 MG per tablet Take 1-2 tablets by mouth every 4 (four) hours as needed. Patient not taking: Reported on 03/26/2018 07/27/14   Elpidio Anis, PA-C  ibuprofen (ADVIL,MOTRIN) 800 MG tablet Take 1 tablet (800 mg total) by mouth 3 (three) times daily. Patient not taking: Reported on 03/26/2018 07/27/14   Elpidio Anis, PA-C    methocarbamol (ROBAXIN) 500 MG tablet Take 1 or 2 po Q 6hrs for muscle pain Patient not taking: Reported on 03/26/2018 09/25/16   Devoria Albe, MD  naproxen (NAPROSYN) 500 MG tablet Take 1 po BID with food prn pain Patient not taking: Reported on 03/26/2018 09/25/16   Devoria Albe, MD    Family History Family History  Problem Relation Age of Onset  . Hypertension Father     Social History Social History   Tobacco Use  . Smoking status: Never Smoker  . Smokeless tobacco: Never Used  Substance Use Topics  . Alcohol use: Yes  . Drug use: No     Allergies   Patient has no known allergies.   Review of Systems Review of Systems  Constitutional: Negative for fever.  Skin: Positive for color change.  Allergic/Immunologic: Negative for immunocompromised state.     Physical Exam Updated Vital Signs BP (!) 145/84 (BP Location: Right Arm)   Pulse 95   Temp 98 F (36.7 C) (Oral)   Resp 14   Ht 5\' 8"  (1.727 m)   Wt 86.2 kg   LMP 03/26/2018   SpO2 98%   BMI 28.89 kg/m   Physical Exam  Constitutional: She appears well-developed and well-nourished.  HENT:  Head: Normocephalic and atraumatic.  Eyes: Conjunctivae are normal.  Cardiovascular: Normal rate and intact distal  pulses.  Pulmonary/Chest: Effort normal.  Abdominal: Soft.  Neurological: She is alert.  Skin: Skin is warm.  Left forearm with well demarcated 7 cm x 6 cm area of erythema with central induration.  There is also a small papule in the center.  Just proximal to this there is another small erythematous papule without significant surrounding erythema.  No fluctuance to either site.  No streaking.  Psychiatric: She has a normal mood and affect. Her behavior is normal.  Nursing note and vitals reviewed.      ED Treatments / Results  Labs (all labs ordered are listed, but only abnormal results are displayed) Labs Reviewed - No data to display  EKG None  Radiology No results found.  Procedures Procedures  (including critical care time)  Medications Ordered in ED Medications - No data to display   Initial Impression / Assessment and Plan / ED Course  I have reviewed the triage vital signs and the nursing notes.  Pertinent labs & imaging results that were available during my care of the patient were reviewed by me and considered in my medical decision making (see chart for details).     Patient with small area of cellulitis to left forearm with some central induration, suspect may be surrounding a hair follicle.  No fluctuance suggestive of abscess.  No purulent drainage.  Afebrile without systemic symptoms. No history of immunocompromise.  Patient had similar episodes on her left arm which required incision and drainage at the end of September, and on her legs which resolved on their own.  Suspect this may be due to her at home waxing.  Suggested patient change her wax pot and maintain good skin hygiene during that process to avoid recurrence of infection.  Patient will be discharged with Keflex and instructed to apply warm compresses frequently to her arm as abscess may develop.  Discussed strict return precautions and follow-up.  Patient is safe for discharge at this time.  Discussed results, findings, treatment and follow up. Patient advised of return precautions. Patient verbalized understanding and agreed with plan.'  Final Clinical Impressions(s) / ED Diagnoses   Final diagnoses:  Cellulitis of left forearm    ED Discharge Orders         Ordered    cephALEXin (KEFLEX) 500 MG capsule  4 times daily     04/12/18 2253           Robinson, [place] N, PA-C 04/12/18 2254    Tegeler, Canary Brim, MD 04/13/18 [phone]

## 2018-04-12 NOTE — ED Notes (Signed)
Circular swelling to the lt upper forearm for 2-3 days  She had a similar lesion on her lt upper arm 2 weeks ago.  No temp  She denies iv drug use

## 2018-09-16 ENCOUNTER — Emergency Department (HOSPITAL_COMMUNITY)
Admission: EM | Admit: 2018-09-16 | Discharge: 2018-09-16 | Disposition: A | Discharge status: Home / Self Care | Payer: Managed Care, Other (non HMO) | Attending: Emergency Medicine | Admitting: Emergency Medicine

## 2018-09-16 ENCOUNTER — Other Ambulatory Visit: Payer: Self-pay

## 2018-09-16 DIAGNOSIS — N10 Acute tubulo-interstitial nephritis: Secondary | ICD-10-CM | POA: Insufficient documentation

## 2018-09-16 LAB — URINALYSIS, ROUTINE W REFLEX MICROSCOPIC
Bilirubin Urine: NEGATIVE
Glucose, UA: NEGATIVE mg/dL
Ketones, ur: NEGATIVE mg/dL
NITRITE: NEGATIVE
Protein, ur: 30 mg/dL — AB
SPECIFIC GRAVITY, URINE: 1.011 (ref 1.005–1.030)
WBC, UA: >50 WBC/hpf — ABNORMAL HIGH (ref 0–5)
pH: 6 (ref 5.0–8.0)

## 2018-09-16 LAB — CBC WITH DIFFERENTIAL/PLATELET
Abs Immature Granulocytes: 0.04 10*3/uL (ref 0.00–0.07)
BASOS ABS: 0.1 10*3/uL (ref 0.0–0.1)
BASOS PCT: 1 %
Eosinophils Absolute: 0.5 10*3/uL (ref 0.0–0.5)
Eosinophils Relative: 4 %
HCT: 40.6 % (ref 36.0–46.0)
Hemoglobin: 13 g/dL (ref 12.0–15.0)
IMMATURE GRANULOCYTES: 0 %
Lymphocytes Relative: 27 %
Lymphs Abs: 2.9 10*3/uL (ref 0.7–4.0)
MCH: 27.8 pg (ref 26.0–34.0)
MCHC: 32 g/dL (ref 30.0–36.0)
MCV: 86.8 fL (ref 80.0–100.0)
Monocytes Absolute: 0.7 10*3/uL (ref 0.1–1.0)
Monocytes Relative: 7 %
NEUTROS PCT: 61 %
NRBC: 0 % (ref 0.0–0.2)
Neutro Abs: 6.7 10*3/uL (ref 1.7–7.7)
PLATELETS: 447 10*3/uL — AB (ref 150–400)
RBC: 4.68 MIL/uL (ref 3.87–5.11)
RDW: 12.8 % (ref 11.5–15.5)
WBC: 10.9 10*3/uL — AB (ref 4.0–10.5)

## 2018-09-16 LAB — COMPREHENSIVE METABOLIC PANEL
ALK PHOS: 54 U/L (ref 38–126)
ALT: 13 U/L (ref 0–44)
AST: 13 U/L — ABNORMAL LOW (ref 15–41)
Albumin: 3.7 g/dL (ref 3.5–5.0)
Anion gap: 4 — ABNORMAL LOW (ref 5–15)
BILIRUBIN TOTAL: 0.4 mg/dL (ref 0.3–1.2)
BUN: 14 mg/dL (ref 6–20)
CALCIUM: 9.1 mg/dL (ref 8.9–10.3)
CO2: 25 mmol/L (ref 22–32)
Chloride: 109 mmol/L (ref 98–111)
Creatinine, Ser: 0.75 mg/dL (ref 0.44–1.00)
GFR calc non Af Amer: >60 mL/min (ref >60)
Glucose, Bld: 89 mg/dL (ref 70–99)
Potassium: 3.9 mmol/L (ref 3.5–5.1)
Sodium: 138 mmol/L (ref 135–145)
TOTAL PROTEIN: 6.8 g/dL (ref 6.5–8.1)

## 2018-09-16 LAB — PREGNANCY, URINE: PREG TEST UR: NEGATIVE

## 2018-09-16 MED ORDER — HYDROCODONE-ACETAMINOPHEN 5-325 MG PO TABS
1.0000 | ORAL_TABLET | Freq: Once | ORAL | Status: DC
  Filled 2018-09-16: qty 1

## 2018-09-16 MED ORDER — CEPHALEXIN 500 MG PO CAPS: 500.0000 mg | ORAL_CAPSULE | Freq: Four times a day (QID) | ORAL | 0 refills | Status: AC

## 2018-09-16 MED ORDER — CEFTRIAXONE SODIUM 1 G IJ SOLR
500.0000 mg | Freq: Once | INTRAMUSCULAR | Status: AC
  Administered 2018-09-16: 500 mg via INTRAMUSCULAR
  Filled 2018-09-16: qty 10

## 2018-09-16 MED ORDER — METRONIDAZOLE 500 MG PO TABS: 500.0000 mg | ORAL_TABLET | Freq: Two times a day (BID) | ORAL | 0 refills | Status: AC

## 2018-09-16 MED ORDER — NAPROXEN 250 MG PO TABS
500.0000 mg | ORAL_TABLET | Freq: Once | ORAL | Status: AC
  Administered 2018-09-16: 500 mg via ORAL
  Filled 2018-09-16: qty 2

## 2018-09-16 MED ORDER — LIDOCAINE HCL (PF) 1 % IJ SOLN
INTRAMUSCULAR | Status: AC
  Filled 2018-09-16: qty 5

## 2018-09-16 NOTE — ED Provider Notes (Signed)
[facility] EMERGENCY DEPARTMENT Provider Note   CSN: [id number] Arrival date & time: 09/16/18  0024    History   Chief Complaint Chief Complaint  Patient presents with  . Flank Pain    left     HPI Marilyn Wheeler is a 28 y.o. female.     HPI  28 year old female comes in a chief complaint of left-sided abdominal and flank pain.  Pain has been present for about 5 days, but it has gotten worse over the last 2 days.  Patient does not have any history of significant medical problems, but does indicate she has had kidney infection in the past.  She reports that she was having UTI-like symptoms few days ago and started on Keflex.  Patient was given a 5-day course and finished the antibiotics 2 days ago.  Patient abdominal pain is located in the left flank and left lower quadrant.  She denies any pain with urination but does indicate that she has frequent urination.  Additionally she denies any bloody stools, fevers, chills, vomiting.  Patient also denies any vaginal discharge, bleeding and denies any history of pelvic disorder.  Patient does not have any history of STDs and denies any high risk sexual behavior.  Past Medical History:  Diagnosis Date  . Anxiety   . Depression     There are no active problems to display for this patient.   No past surgical history on file.   OB History   No obstetric history on file.      Home Medications    Prior to Admission medications   Medication Sig Start Date End Date Taking? Authorizing Provider  cephALEXin (KEFLEX) 500 MG capsule Take 1 capsule (500 mg total) by mouth 4 (four) times daily. 09/16/18   Derwood Kaplan, MD  HYDROcodone-acetaminophen (NORCO/VICODIN) 5-325 MG per tablet Take 1-2 tablets by mouth every 4 (four) hours as needed. Patient not taking: Reported on 03/26/2018 07/27/14   Elpidio Anis, PA-C  ibuprofen (ADVIL,MOTRIN) 800 MG tablet Take 1 tablet (800 mg total) by mouth 3 (three) times daily.  Patient not taking: Reported on 03/26/2018 07/27/14   Elpidio Anis, PA-C  methocarbamol (ROBAXIN) 500 MG tablet Take 1 or 2 po Q 6hrs for muscle pain Patient not taking: Reported on 03/26/2018 09/25/16   Devoria Albe, MD  metroNIDAZOLE (FLAGYL) 500 MG tablet Take 1 tablet (500 mg total) by mouth 2 (two) times daily. 09/16/18   Derwood Kaplan, MD  naproxen (NAPROSYN) 500 MG tablet Take 1 po BID with food prn pain Patient not taking: Reported on 03/26/2018 09/25/16   Devoria Albe, MD    Family History Family History  Problem Relation Age of Onset  . Hypertension Father     Social History Social History   Tobacco Use  . Smoking status: Never Smoker  . Smokeless tobacco: Never Used  Substance Use Topics  . Alcohol use: Yes  . Drug use: No     Allergies   Patient has no known allergies.   Review of Systems Review of Systems  Constitutional: Positive for activity change.  Gastrointestinal: Positive for abdominal pain. Negative for nausea and vomiting.  Genitourinary: Positive for flank pain and urgency. Negative for dysuria, vaginal bleeding, vaginal discharge and vaginal pain.  Allergic/Immunologic: Negative for immunocompromised state.  All other systems reviewed and are negative.    Physical Exam Updated Vital Signs BP 117/80   Pulse 96   Temp 99.5 F (37.5 C) (Oral)   Ht 5\' 8"  (1.727  m)   Wt 83.9 kg   SpO2 100%   BMI 28.13 kg/m   Physical Exam Vitals signs and nursing note reviewed.  Constitutional:      Appearance: She is well-developed.  HENT:     Head: Normocephalic and atraumatic.  Eyes:     Pupils: Pupils are equal, round, and reactive to light.  Neck:     Musculoskeletal: Neck supple.  Cardiovascular:     Rate and Rhythm: Normal rate and regular rhythm.     Heart sounds: Normal heart sounds. No murmur.  Pulmonary:     Effort: Pulmonary effort is normal. No respiratory distress.  Abdominal:     General: There is no distension.     Palpations: Abdomen is  soft.     Tenderness: There is abdominal tenderness. There is left CVA tenderness. There is no guarding or rebound.  Skin:    General: Skin is warm and dry.  Neurological:     Mental Status: She is alert and oriented to person, place, and time.      ED Treatments / Results  Labs (all labs ordered are listed, but only abnormal results are displayed) Labs Reviewed  COMPREHENSIVE METABOLIC PANEL - Abnormal; Notable for the following components:      Result Value   AST 13 (*)    Anion gap 4 (*)    All other components within normal limits  CBC WITH DIFFERENTIAL/PLATELET - Abnormal; Notable for the following components:   WBC 10.9 (*)    Platelets 447 (*)    All other components within normal limits  URINALYSIS, ROUTINE W REFLEX MICROSCOPIC - Abnormal; Notable for the following components:   Color, Urine STRAW (*)    APPearance HAZY (*)    Hgb urine dipstick MODERATE (*)    Protein, ur 30 (*)    Leukocytes,Ua MODERATE (*)    WBC, UA >50 (*)    Bacteria, UA FEW (*)    All other components within normal limits  URINE CULTURE  PREGNANCY, URINE    EKG None  Radiology No results found.  Procedures Procedures (including critical care time)  Medications Ordered in ED Medications  cefTRIAXone (ROCEPHIN) injection 500 mg (has no administration in time range)  naproxen (NAPROSYN) tablet 500 mg (500 mg Oral Given 09/16/18 0138)     Initial Impression / Assessment and Plan / ED Course  I have reviewed the triage vital signs and the nursing notes.  Pertinent labs & imaging results that were available during my care of the patient were reviewed by me and considered in my medical decision making (see chart for details).  Clinical Course as of Sep 15 252  Sun Sep 16, 2018  0251 UA is positive for pyuria, hematuria and leukocytes.  White count is only mildly elevated.  LFTs are normal. At this time it does appear clinically that she likely has infectious process in her abdomen.   Diverticulitis versus pyelonephritis are still possibility because of unchanged abdominal exam revealing left-sided tenderness over the abdomen and flank region.  I informed patient of the findings in the ER and suspicion for pyelonephritis versus diverticulitis.  We will send her home with Keflex and Flagyl to cover possibility of both of those infections.  Urine culture has also been sent.  Patient has been advised to return to the ER if her symptoms are progressing.  Strict ER return precautions discussed, if patient has to come back then we will probably have to get a pelvic exam and  further advanced imaging.   [AN]    Clinical Course User Index [AN] Derwood Kaplan, MD       28 year old female comes in a chief complaint of left lower quadrant abdominal pain and left flank pain.  She is noted to have tenderness on the left side of her abdomen and the flank region.  Tenderness is reproduced with palpation.  Lung exam is clear and there is no risk factors for PE.  Patient denies any history of pelvic disorders and there is no clinical concerns for STDs based on history for this very reliable patient.  Differential diagnosis right now includes pyelonephritis versus diverticulitis versus renal stone.  The latter is less likely because the pain is not colicky nor is it coming in waves.  For the same reason we do not think patient is having ovarian torsion at this time.  Urine analysis will be ordered and we will reassess the patient and decide on the next steps.  Final Clinical Impressions(s) / ED Diagnoses   Final diagnoses:  Acute pyelonephritis    ED Discharge Orders         Ordered    cephALEXin (KEFLEX) 500 MG capsule  4 times daily     09/16/18 0245    metroNIDAZOLE (FLAGYL) 500 MG tablet  2 times daily     09/16/18 0245           Derwood Kaplan, MD 09/16/18 [phone]

## 2018-09-16 NOTE — ED Notes (Signed)
Pt able to tolerate 3/4 of cup of water.

## 2018-09-16 NOTE — ED Triage Notes (Signed)
History of UTI prescribed keflex still having cramping abd pain to left side for the past 2 days. 8/10

## 2018-09-16 NOTE — Discharge Instructions (Signed)
We signed the ER for the left-sided abdominal and back pain. We suspect that you are having kidney infection.  Other possibility is a condition called diverticulitis (READ INSTRUCTIONS).  Take the medications prescribed that should cover you for both kidney infection and diverticulitis. Return to the ER immediately if you start having severe pain, severe vomiting, high fevers or bloody stools.  We recommend that you follow-up with your primary care doctor in 5 to 7 days.

## 2018-09-18 LAB — URINE CULTURE: Culture: >=100000 — AB

## 2018-09-19 ENCOUNTER — Telehealth: Payer: Self-pay | Admitting: Emergency Medicine

## 2018-09-19 NOTE — Progress Notes (Signed)
ED Antimicrobial Stewardship Positive Culture Follow Up   Marilyn Wheeler is an 28 y.o. female who presented to [facility] on 09/16/2018 with a chief complaint of  Chief Complaint  Patient presents with  . Flank Pain    left     Recent Results (from the past 720 hour(s))  Urine culture     Status: Abnormal   Collection Time: 09/16/18 12:46 AM  Result Value Ref Range Status   Specimen Description URINE, CLEAN CATCH  Final   Special Requests NONE  Final   Culture (A)  Final    >=100,000 COLONIES/mL STAPHYLOCOCCUS SAPROPHYTICUS 10,000 COLONIES/mL GROUP B STREP(S.AGALACTIAE)ISOLATED ORGANISM 2 TESTING AGAINST S. AGALACTIAE NOT ROUTINELY PERFORMED DUE TO PREDICTABILITY OF AMP/PEN/VAN SUSCEPTIBILITY. Performed at [facility], [address], [place], [place] [address]    Report Status 09/18/2018 FINAL  Final   Organism ID, Bacteria STAPHYLOCOCCUS SAPROPHYTICUS (A)  Final      Susceptibility   Staphylococcus saprophyticus - MIC*    CIPROFLOXACIN <=0.5 SENSITIVE Sensitive     GENTAMICIN <=0.5 SENSITIVE Sensitive     NITROFURANTOIN <=16 SENSITIVE Sensitive     OXACILLIN 0.5 RESISTANT Resistant     TETRACYCLINE <=1 SENSITIVE Sensitive     VANCOMYCIN <=0.5 SENSITIVE Sensitive     TRIMETH/SULFA <=10 SENSITIVE Sensitive     CLINDAMYCIN >=8 RESISTANT Resistant     RIFAMPIN <=0.5 SENSITIVE Sensitive     Inducible Clindamycin NEGATIVE Sensitive     * >=100,000 COLONIES/mL STAPHYLOCOCCUS SAPROPHYTICUS    [x]  Treated with cephalexin, organism resistant to prescribed antimicrobial  Recommend: stop cephalexin New antibiotic prescription: start Bactrim DS 1 tab po 2 times per day x 10 days  ED Provider: Fayrene Helper, PA-C   Tera Mater 09/19/2018, 9:19 AM Clinical Pharmacist Monday - Friday phone -  [phone] Saturday - Sunday phone - [phone]

## 2018-09-19 NOTE — Telephone Encounter (Signed)
Post ED Visit - Positive Culture Follow-up: Successful Patient Follow-Up  Culture assessed and recommendations reviewed by:  []  Enzo Bi, Pharm.D. []  Celedonio Miyamoto, Pharm.D., BCPS AQ-ID []  Garvin Fila, Pharm.D., BCPS []  Georgina Pillion, [address].D., BCPS []  [place], [address].D., BCPS, AAHIVP []  Estella Husk, Pharm.D., BCPS, AAHIVP []  Lysle Pearl, PharmD, BCPS []  Phillips Climes, PharmD, BCPS []  Agapito Games, PharmD, BCPS []  Verlan Friends, PharmD Mervyn Gay PharmD  Positive urine culture  []  Patient discharged without antimicrobial prescription and treatment is now indicated [x]  Organism is resistant to prescribed ED discharge antimicrobial []  Patient with positive blood cultures  Changes discussed with ED provider: Fayrene Helper PA New antibiotic prescription stop keflex, continue metronidazole, start bactrim DS 1 po bid x 10 days  Attempting to contact patient   Berle Mull 09/19/2018, 11:43 AM

## 2018-11-14 ENCOUNTER — Telehealth: Payer: Self-pay | Admitting: *Deleted

## 2019-12-25 IMAGING — CR DG CHEST 2V
2 series · 2 of 2 positions shown · non-contrast
Comparison: 09/25/2016

CLINICAL DATA: Soft tissue infection of the right arm.

EXAM:
CHEST - 2 VIEW

[w chest pa]
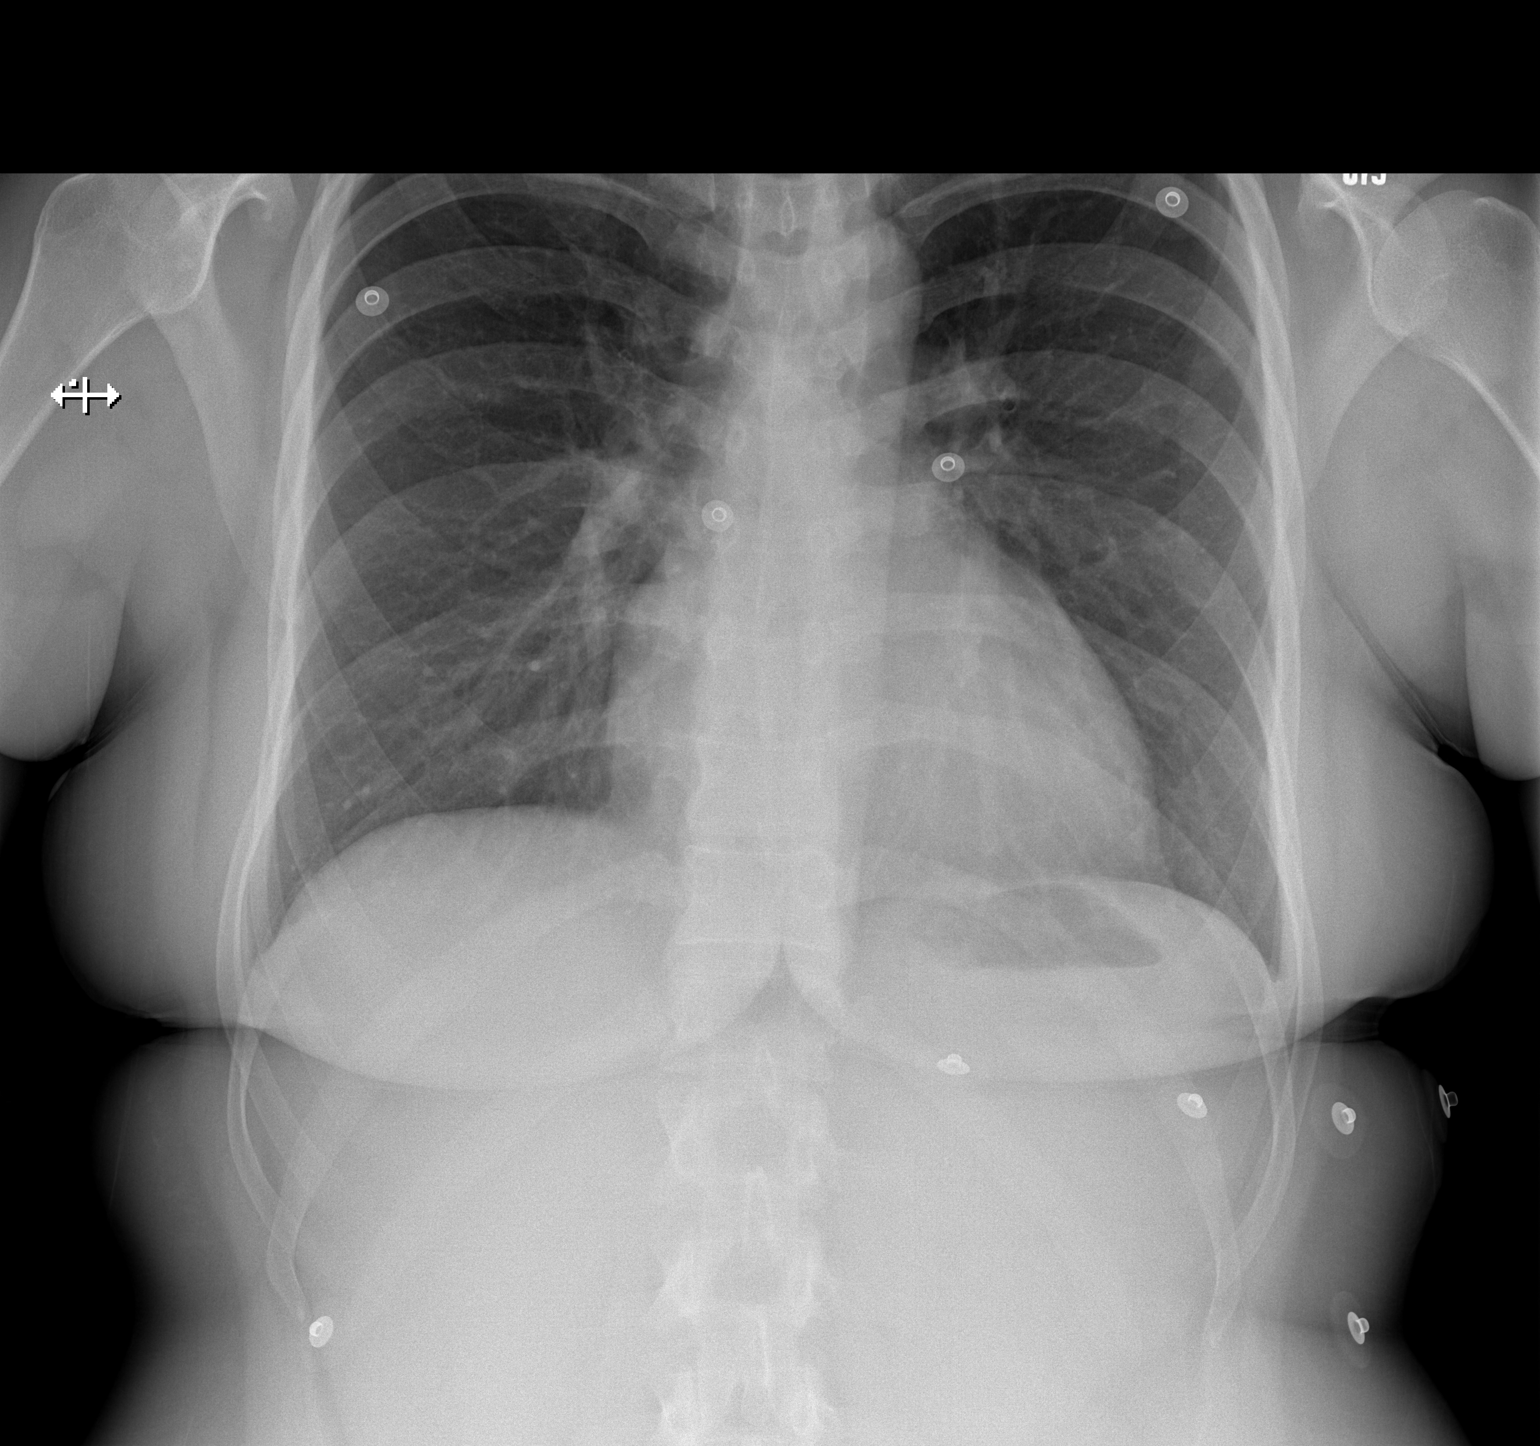

[w chest lat]
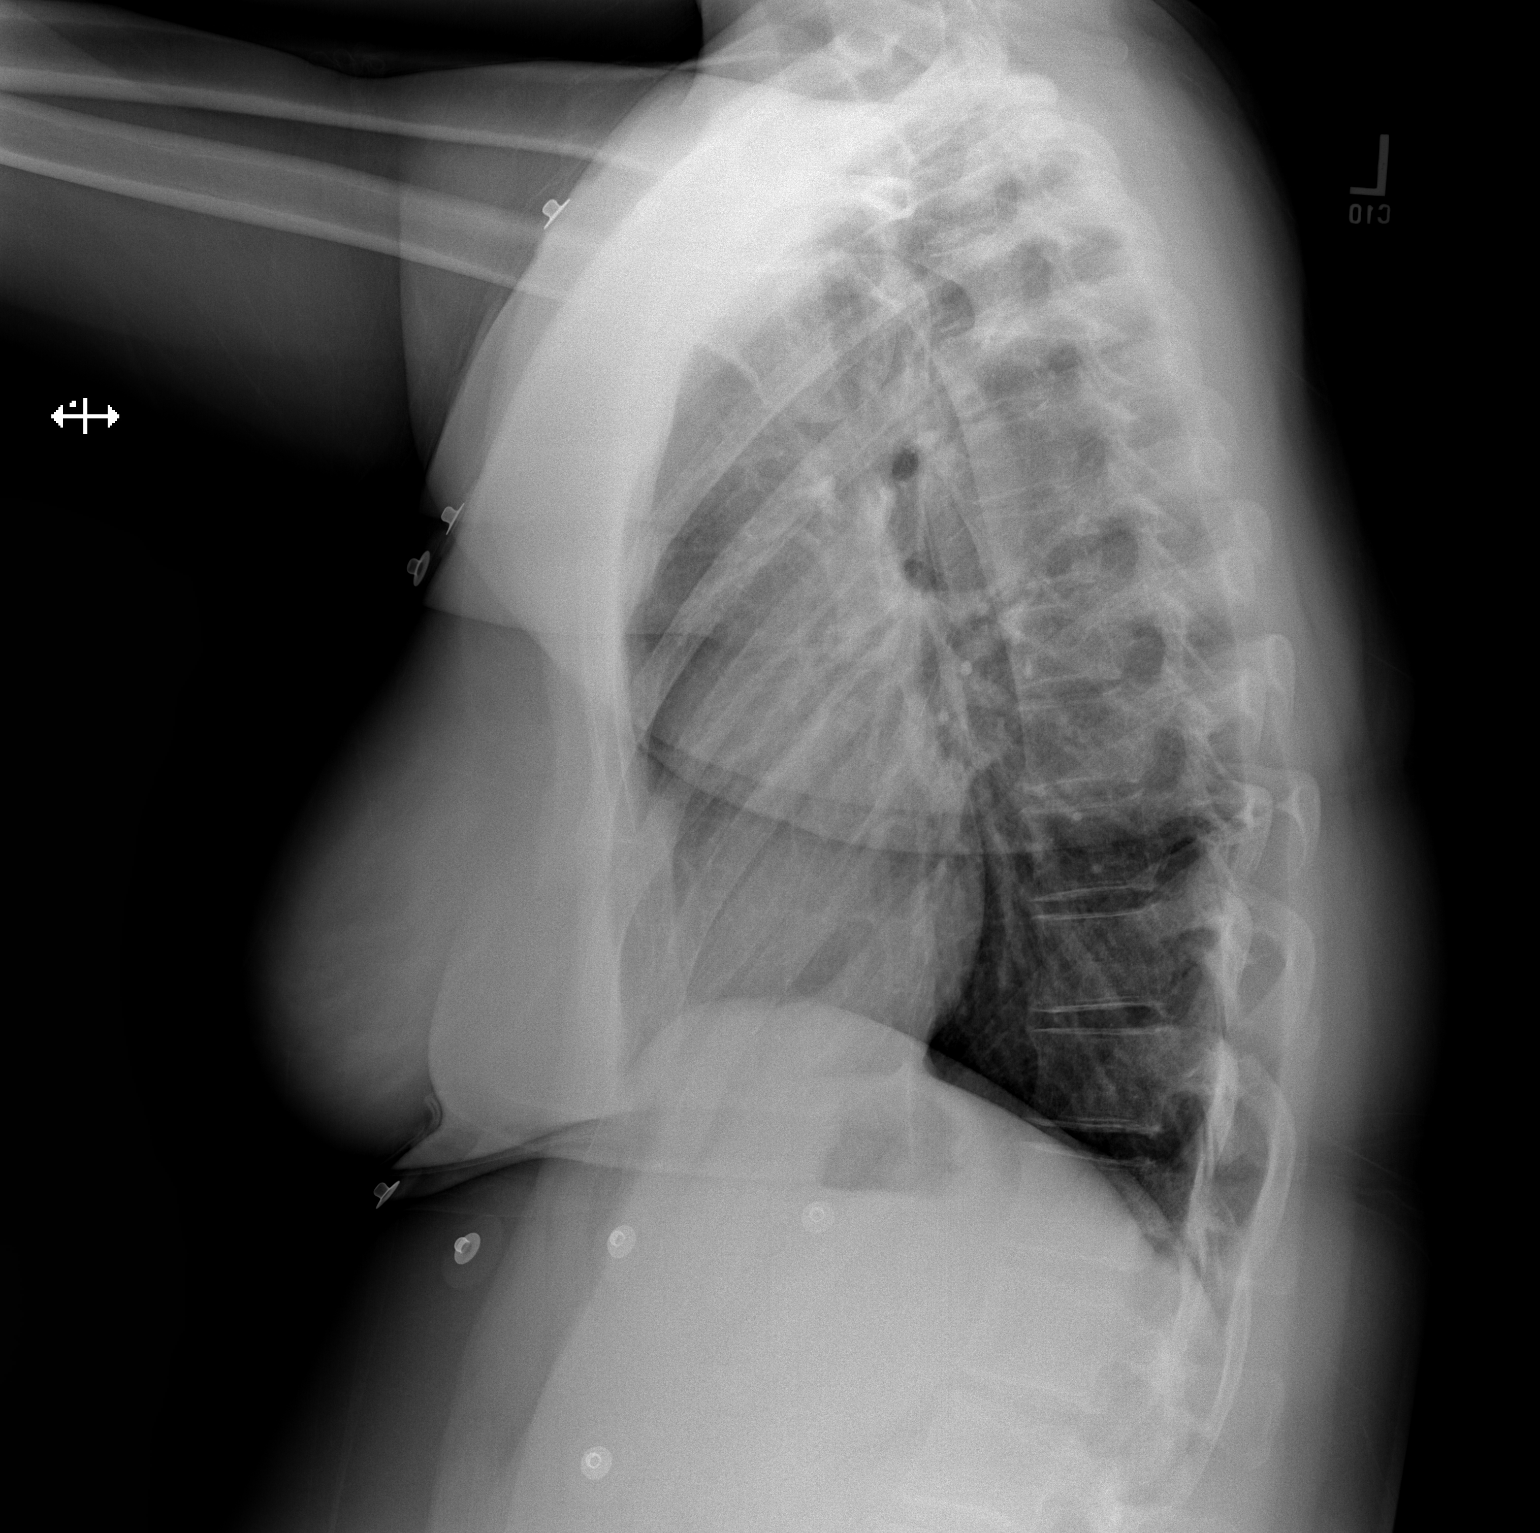

[2 of 2 positions shown; findings below may reference images not displayed]

FINDINGS: Heart size is normal. Mediastinal shadows are normal. The lungs are
clear. No bronchial thickening. No infiltrate, mass, effusion or
collapse. Pulmonary vascularity is normal. No bony abnormality.
IMPRESSION: Normal chest

## 2022-07-28 ENCOUNTER — Encounter: Payer: Self-pay | Admitting: Family Medicine
